# Patient Record
Sex: Male | Born: 1970 | Race: White | Hispanic: No | Smoking: Current some day smoker
Health system: Southern US, Community
[De-identification: ages and names within clinical notes are randomized; demographics above are authoritative.]

## PROBLEM LIST (undated history)

## (undated) DIAGNOSIS — I10 Essential (primary) hypertension: Secondary | ICD-10-CM

## (undated) DIAGNOSIS — E119 Type 2 diabetes mellitus without complications: Secondary | ICD-10-CM

## (undated) HISTORY — PX: TONSILLECTOMY: SUR1361

## (undated) HISTORY — PX: FRACTURE SURGERY: SHX138

---

## 2012-08-19 ENCOUNTER — Ambulatory Visit: Payer: Self-pay | Admitting: Family Medicine

## 2013-11-04 ENCOUNTER — Ambulatory Visit: Payer: Self-pay | Admitting: Internal Medicine

## 2014-10-17 ENCOUNTER — Encounter: Payer: Self-pay | Admitting: Emergency Medicine

## 2014-10-17 ENCOUNTER — Emergency Department: Payer: Self-pay

## 2014-10-17 ENCOUNTER — Emergency Department
Admission: EM | Admit: 2014-10-17 | Discharge: 2014-10-17 | Disposition: A | Discharge status: Home / Self Care | Payer: Self-pay | Attending: Emergency Medicine | Admitting: Emergency Medicine

## 2014-10-17 DIAGNOSIS — M79642 Pain in left hand: Secondary | ICD-10-CM | POA: Insufficient documentation

## 2014-10-17 DIAGNOSIS — I10 Essential (primary) hypertension: Secondary | ICD-10-CM | POA: Insufficient documentation

## 2014-10-17 DIAGNOSIS — M7989 Other specified soft tissue disorders: Secondary | ICD-10-CM | POA: Insufficient documentation

## 2014-10-17 DIAGNOSIS — Z720 Tobacco use: Secondary | ICD-10-CM | POA: Insufficient documentation

## 2014-10-17 HISTORY — DX: Essential (primary) hypertension: I10

## 2014-10-17 MED ORDER — SULFAMETHOXAZOLE-TRIMETHOPRIM 400-80 MG PO TABS: 1.0000 | ORAL_TABLET | Freq: Two times a day (BID) | ORAL | Status: DC

## 2014-10-17 MED ORDER — OXYCODONE-ACETAMINOPHEN 5-325 MG PO TABS: 1.0000 | ORAL_TABLET | Freq: Three times a day (TID) | ORAL | Status: AC | PRN

## 2014-10-17 MED ORDER — MELOXICAM 15 MG PO TABS: 15.0000 mg | ORAL_TABLET | Freq: Every day | ORAL | Status: DC | PRN

## 2014-10-17 NOTE — ED Notes (Signed)
Pt informed to return if any life threatening symptoms occur.  

## 2014-10-17 NOTE — ED Provider Notes (Signed)
[facility] Emergency Department Provider Note  ____________________________________________  Time seen: Approximately 8:13 AM  I have reviewed the triage vital signs and the nursing notes.   HISTORY  Chief Complaint Hand Pain and Joint Pain   HPI Parker Ponce is a 44 y.o. male presents to the ER with mother at the bedside for a complaint of left hand pain. Patient reports that his pain is mostly in left third finger for approximately 2 weeks. Patient states that he may have hit it on something but unsure if he definitely did. Patient states pain is 7 out of 10 aching pain and very tender to touch. Patient reports swelling intermittently, and reports that swelling today is actually decreased. Denies recent redness, drainage, break in skin. Denies fever.   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 Mother reports strong family history with herself and patient's brother with HSP, and states they often have joint pain. 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 Patient reports he has had this similar in the past, in the same finger and in all joints but states that usually it goes away by 2 weeks. Patient states that he presented today as he is concerned he may have broken his finger and wants to make sure it is not broken.  Denies chest pain, shortness of breath, abdominal pain, fever, urinary changes,  neck or back pain. Reports he frequently has joint pain in hands and feet and elbows but states left hand is the only pain at this time.   Past Medical History  Diagnosis Date  . Hypertension     There are no active problems to display for this patient.   Past Surgical History  Procedure Laterality Date  . Fracture surgery      Current Outpatient Rx  Name   Route  Sig  Dispense  Refill  . "blood pressure medication"          .           Marland Kitchen.             Allergies Review of patient's allergies indicates no known allergies.  No family history on file.  Social History History  Substance Use Topics  . Smoking status: Current Every Day Smoker  . Smokeless tobacco: Never Used  . Alcohol Use: No    Review of Systems Constitutional: No fever/chills Eyes: No visual changes. ENT: No sore throat. Cardiovascular: Denies chest pain. Respiratory: Denies shortness of breath. Gastrointestinal: No abdominal pain.  No nausea, no vomiting.  No diarrhea.  No constipation. Genitourinary: Negative for dysuria. Musculoskeletal: Negative for back pain. Left hand pain. Skin: Negative for rash. Neurological: Negative for headaches, focal weakness or numbness.  10-point ROS otherwise negative.  ____________________________________________   PHYSICAL EXAM:  VITAL SIGNS: ED Triage Vitals  Enc Vitals Group     BP 10/17/14 0801 134/72 mmHg     Pulse Rate 10/17/14 0801 72     Resp 10/17/14 0801 18     Temp 10/17/14 0801 97.6 F (36.4 C)     Temp Source 10/17/14 0801 Oral     SpO2 10/17/14 0801 97 %     Weight 10/17/14 0801 260 lb (117.935 kg)     Height 10/17/14 0801 5\' 9"  (1.753 m)     Head Cir --  Peak Flow --      Pain Score --      Pain Loc --      Pain Edu? --      Excl. in GC? --     Constitutional: Alert and oriented. Well appearing and in no acute distress. Eyes: Conjunctivae are normal. PERRL. EOMI. Head: Atraumatic. Nose: No congestion/rhinnorhea. Mouth/Throat: Mucous membranes are moist.  Oropharynx non-erythematous. Neck: No stridor.  No cervical spine tenderness to palpation. Hematological/Lymphatic/Immunilogical: No cervical lymphadenopathy. Cardiovascular: Normal rate, regular rhythm. Grossly normal heart sounds.  Good peripheral circulation. Respiratory: Normal respiratory effort.  No retractions. Lungs  CTAB. Gastrointestinal: Soft and nontender. No distention. No abdominal bruits. No CVA tenderness. Musculoskeletal: No lower extremity tenderness nor edema.  No joint effusions. Left hand third digit PIP joint moderate tender to palpation with mild to moderate swelling. Skin is intact. No erythema, fluctuance, pointing or induration. Full range of motion. Sensation intact and motor intact. Bilateral handgrips equal. Distal radial pulses equal bilaterally. Capillary refill <2 secs.  Left upper extremity nontender above the left hand. Right upper extremity and bilateral lower extremities nontender. Steady gait in room. Changes position quickly without distress. Neurologic:  Normal speech and language. No gross focal neurologic deficits are appreciated. Speech is normal. No gait instability. Skin:  Skin is warm, dry and intact. No rash noted. Psychiatric: Mood and affect are normal. Speech and behavior are normal.  ______________________________________  RADIOLOGY  LEFT HAND - COMPLETE 3+ VIEW  COMPARISON: None.  FINDINGS: Frontal, oblique, and lateral views obtained. There is soft tissue swelling of the third digit. No acute fracture or dislocation. Joint spaces appear intact. No erosive change or bony destruction. Well corticated calcification in the region of the triangular fibrocartilage may represent residua of old trauma.  IMPRESSION: Soft tissue swelling of third digit of uncertain etiology. This finding potentially could have arthropathic etiology, although infectious etiology is a differential consideration based on this appearance. No abscess seen. The joint spaces appear intact. Question old trauma in the triangular fibrocartilage region. No acute fracture or dislocation.   Electronically Signed By: Bretta Bang III M.D. On: 10/17/2014 08:52 ____________________________________________  ____________________________________________   INITIAL IMPRESSION /  ASSESSMENT AND PLAN / ED COURSE  Pertinent labs & imaging results that were available during my care of the patient were reviewed by me and considered in my medical decision making (see chart for details).  Patient presents to the ER for complaints of left hand pain, worse at left third PIP. Will evaluate by x-ray. 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 No signs of infection.  X-ray positive for soft tissue swelling of third digit, no abscess seen, no acute fracture or dislocation. Left hand pain, full ROM. Skin intact, no erythema. Suspect inflammation and likely related to patient's history of inflammatory disorder. Will treat with oral mobic and percocet as needed, ice and elevate. No signs of infection however due to swelling will also treat with oral bactrim. Discussed need for close follow up and return parameters. Follow up with PCP next week. Patient and mother agreed to plan and verbalized understanding.  ____________________________________________   FINAL CLINICAL IMPRESSION(S) / ED DIAGNOSES  Final diagnoses:  Hand pain, left      Renford Dills, NP 10/17/14 1137  Minna Antis, MD 10/17/14 [phone]

## 2014-10-17 NOTE — Discharge Instructions (Signed)
Take medication as prescribed. Apply ice and keep it elevated.  Follow up with her primary care physician or orthopedic next week.  Return to the ER for increased pain, increased swelling, redness, drainage, new or worsening concerns.  Musculoskeletal Pain Musculoskeletal pain is muscle and boney aches and pains. These pains can occur in any part of the body. Your caregiver may treat you without knowing the cause of the pain. They may treat you if blood or urine tests, X-rays, and other tests were normal.  CAUSES There is often not a definite cause or reason for these pains. These pains may be caused by a type of germ (virus). The discomfort may also come from overuse. Overuse includes working out too hard when your body is not fit. Boney aches also come from weather changes. Bone is sensitive to atmospheric pressure changes. HOME CARE INSTRUCTIONS   Ask when your test results will be ready. Make sure you get your test results.  Only take over-the-counter or prescription medicines for pain, discomfort, or fever as directed by your caregiver. If you were given medications for your condition, do not drive, operate machinery or power tools, or sign legal documents for 24 hours. Do not drink alcohol. Do not take sleeping pills or other medications that may interfere with treatment.  Continue all activities unless the activities cause more pain. When the pain lessens, slowly resume normal activities. Gradually increase the intensity and duration of the activities or exercise.  During periods of severe pain, bed rest may be helpful. Lay or sit in any position that is comfortable.  Putting ice on the injured area.  Put ice in a bag.  Place a towel between your skin and the bag.  Leave the ice on for 15 to 20 minutes, 3 to 4 times a day.  Follow up with your caregiver for continued problems and no reason can be found for the pain. If the pain becomes worse or does not go away, it may be necessary  to repeat tests or do additional testing. Your caregiver may need to look further for a possible cause. SEEK IMMEDIATE MEDICAL CARE IF:  You have pain that is getting worse and is not relieved by medications.  You develop chest pain that is associated with shortness or breath, sweating, feeling sick to your stomach (nauseous), or throw up (vomit).  Your pain becomes localized to the abdomen.  You develop any new symptoms that seem different or that concern you. MAKE SURE YOU:   Understand these instructions.  Will watch your condition.  Will get help right away if you are not doing well or get worse. Document Released: 05/09/2005 Document Revised: 08/01/2011 Document Reviewed: 01/11/2013 [facility] Patient Information 2015 [place] [place]. This information is not intended to replace advice given to you by your health care provider. Make sure you discuss any questions you have with your health care provider.

## 2014-10-17 NOTE — ED Notes (Signed)
Patient to ER for swelling to left middle finger. No redness or warmth present.

## 2015-01-10 ENCOUNTER — Emergency Department
Admission: EM | Admit: 2015-01-10 | Discharge: 2015-01-10 | Disposition: A | Discharge status: Home / Self Care | Payer: Self-pay | Attending: Emergency Medicine | Admitting: Emergency Medicine

## 2015-01-10 ENCOUNTER — Emergency Department: Payer: Self-pay

## 2015-01-10 ENCOUNTER — Encounter: Payer: Self-pay | Admitting: Emergency Medicine

## 2015-01-10 DIAGNOSIS — Z87891 Personal history of nicotine dependence: Secondary | ICD-10-CM | POA: Insufficient documentation

## 2015-01-10 DIAGNOSIS — I10 Essential (primary) hypertension: Secondary | ICD-10-CM | POA: Insufficient documentation

## 2015-01-10 DIAGNOSIS — L03114 Cellulitis of left upper limb: Secondary | ICD-10-CM

## 2015-01-10 DIAGNOSIS — Z79899 Other long term (current) drug therapy: Secondary | ICD-10-CM | POA: Insufficient documentation

## 2015-01-10 LAB — CBC WITH DIFFERENTIAL/PLATELET
BASOS PCT: 1 %
Basophils Absolute: 0 10*3/uL (ref 0–0.1)
Eosinophils Absolute: 0.1 10*3/uL (ref 0–0.7)
Eosinophils Relative: 2 %
HEMATOCRIT: 44.4 % (ref 40.0–52.0)
Hemoglobin: 15 g/dL (ref 13.0–18.0)
LYMPHS PCT: 30 %
Lymphs Abs: 2.3 10*3/uL (ref 1.0–3.6)
MCH: 31.3 pg (ref 26.0–34.0)
MCHC: 33.8 g/dL (ref 32.0–36.0)
MCV: 92.6 fL (ref 80.0–100.0)
MONO ABS: 0.5 10*3/uL (ref 0.2–1.0)
Monocytes Relative: 7 %
NEUTROS ABS: 4.6 10*3/uL (ref 1.4–6.5)
Neutrophils Relative %: 60 %
Platelets: 202 10*3/uL (ref 150–440)
RBC: 4.8 MIL/uL (ref 4.40–5.90)
RDW: 12 % (ref 11.5–14.5)
WBC: 7.6 10*3/uL (ref 3.8–10.6)

## 2015-01-10 LAB — COMPREHENSIVE METABOLIC PANEL
ALT: 158 U/L — ABNORMAL HIGH (ref 17–63)
ANION GAP: 11 (ref 5–15)
AST: 138 U/L — ABNORMAL HIGH (ref 15–41)
Albumin: 3.6 g/dL (ref 3.5–5.0)
Alkaline Phosphatase: 124 U/L (ref 38–126)
BILIRUBIN TOTAL: 0.2 mg/dL — AB (ref 0.3–1.2)
BUN: 13 mg/dL (ref 6–20)
CO2: 21 mmol/L — ABNORMAL LOW (ref 22–32)
Calcium: 8.9 mg/dL (ref 8.9–10.3)
Chloride: 97 mmol/L — ABNORMAL LOW (ref 101–111)
Creatinine, Ser: 0.97 mg/dL (ref 0.61–1.24)
GFR calc Af Amer: >60 mL/min (ref >60)
Glucose, Bld: 205 mg/dL — ABNORMAL HIGH (ref 65–99)
POTASSIUM: 3.9 mmol/L (ref 3.5–5.1)
Sodium: 129 mmol/L — ABNORMAL LOW (ref 135–145)
TOTAL PROTEIN: 7.2 g/dL (ref 6.5–8.1)

## 2015-01-10 MED ORDER — KETOROLAC TROMETHAMINE 30 MG/ML IJ SOLN
30.0000 mg | Freq: Once | INTRAMUSCULAR | Status: AC
  Administered 2015-01-10: 30 mg via INTRAVENOUS
  Filled 2015-01-10: qty 1

## 2015-01-10 MED ORDER — IBUPROFEN 800 MG PO TABS: 800.0000 mg | ORAL_TABLET | Freq: Three times a day (TID) | ORAL | Status: AC | PRN

## 2015-01-10 MED ORDER — CLINDAMYCIN PHOSPHATE 600 MG/50ML IV SOLN
600.0000 mg | Freq: Once | INTRAVENOUS | Status: AC
  Administered 2015-01-10: 600 mg via INTRAVENOUS
  Filled 2015-01-10: qty 50

## 2015-01-10 MED ORDER — TRAMADOL HCL 50 MG PO TABS: 50.0000 mg | ORAL_TABLET | Freq: Four times a day (QID) | ORAL | Status: AC | PRN

## 2015-01-10 MED ORDER — CLINDAMYCIN HCL 150 MG PO CAPS: 150.0000 mg | ORAL_CAPSULE | Freq: Four times a day (QID) | ORAL | Status: DC

## 2015-01-10 NOTE — ED Provider Notes (Signed)
[facility] Emergency Department Provider Note  ____________________________________________  Time seen: Approximately 12:06 PM  I have reviewed the triage vital signs and the nursing notes.   HISTORY  Chief Complaint Hand Pain    HPI Parker Ponce is a 44 y.o. male complaining of left hand pain, edema and erythema times one week. Patient had a Fish barb stuck in his hand. Patient states 2 days after removal of the barb he noticed redness and swelling. Patient's stay in the last 24 hours he has noted increasing swelling and redness. Patient denies any fever or discharge from the incision site. Patient state is a burning type pain and he rates it as 10 over 10. No palliative measures taken for this complaint.  Past Medical History  Diagnosis Date  . Hypertension     There are no active problems to display for this patient.   Past Surgical History  Procedure Laterality Date  . Fracture surgery      Current Outpatient Rx  Name  Route  Sig  Dispense  Refill  . clindamycin (CLEOCIN) 150 MG capsule   Oral   Take 1 capsule (150 mg total) by mouth 4 (four) times daily.   40 capsule   0   . ibuprofen (ADVIL,MOTRIN) 800 MG tablet   Oral   Take 1 tablet (800 mg total) by mouth every 8 (eight) hours as needed for moderate pain.   15 tablet   0   . meloxicam (MOBIC) 15 MG tablet   Oral   Take 1 tablet (15 mg total) by mouth daily as needed for pain.   10 tablet   0   . oxyCODONE-acetaminophen (ROXICET) 5-325 MG per tablet   Oral   Take 1 tablet by mouth every 8 (eight) hours as needed for moderate pain or severe pain (Do not drive or operate heavy machinery while taking as can cause drowsiness.).   12 tablet   0   . sulfamethoxazole-trimethoprim (BACTRIM) 400-80 MG per tablet   Oral   Take 1 tablet by mouth 2 (two) times daily.   20 tablet   0   . traMADol (ULTRAM) 50 MG tablet   Oral   Take 1 tablet (50 mg total) by mouth every 6 (six)  hours as needed for moderate pain.   12 tablet   0     Allergies Review of patient's allergies indicates no known allergies.  No family history on file.  Social History Social History  Substance Use Topics  . Smoking status: Former Smoker    Types: Cigarettes  . Smokeless tobacco: Never Used  . Alcohol Use: No    Review of Systems Constitutional: No fever/chills Eyes: No visual changes. ENT: No sore throat. Cardiovascular: Denies chest pain. Respiratory: Denies shortness of breath. Gastrointestinal: No abdominal pain.  No nausea, no vomiting.  No diarrhea.  No constipation. Genitourinary: Negative for dysuria. Musculoskeletal: Pain and edema to the left hand. Skin: Negative for rash. Erythema and edema dorsal aspect of left hand. Scabbed over puncture wound to the dorsal aspect of the left hand. Neurological: Negative for headaches, focal weakness or numbness. Endocrine:Hypertension 10-point ROS otherwise negative.  ____________________________________________   PHYSICAL EXAM:  VITAL SIGNS: ED Triage Vitals  Enc Vitals Group     BP 01/10/15 1036 123/45 mmHg     Pulse Rate 01/10/15 1036 77     Resp 01/10/15 1036 18     Temp 01/10/15 1036 98.2 F (36.8 C)  Temp Source 01/10/15 1036 Oral     SpO2 01/10/15 1036 97 %     Weight 01/10/15 1036 240 lb (108.863 kg)     Height 01/10/15 1036 5\' 9"  (1.753 m)     Head Cir --      Peak Flow --      Pain Score 01/10/15 1038 10     Pain Loc --      Pain Edu? --      Excl. in GC? --     Constitutional: Alert and oriented. Well appearing and in no acute distress. Eyes: Conjunctivae are normal. PERRL. EOMI. Head: Atraumatic. Nose: No congestion/rhinnorhea. Mouth/Throat: Mucous membranes are moist.  Oropharynx non-erythematous. Neck: No stridor. No cervical spine tenderness to palpation. Hematological/Lymphatic/Immunilogical: No cervical lymphadenopathy. Cardiovascular: Normal rate, regular rhythm. Grossly normal  heart sounds.  Good peripheral circulation. Respiratory: Normal respiratory effort.  No retractions. Lungs CTAB. Gastrointestinal: Soft and nontender. No distention. No abdominal bruits. No CVA tenderness. Musculoskeletal: No lower extremity tenderness nor edema.  No joint effusions. Neurologic:  Normal speech and language. No gross focal neurologic deficits are appreciated. No gait instability. Skin:  Edema and erythema dorsal aspect of the left hand. Psychiatric: Mood and affect are normal. Speech and behavior are normal.  ____________________________________________   LABS (all labs ordered are listed, but only abnormal results are displayed)  Labs Reviewed  COMPREHENSIVE METABOLIC PANEL - Abnormal; Notable for the following:    Sodium 129 (*)    Chloride 97 (*)    CO2 21 (*)    Glucose, Bld 205 (*)    AST 138 (*)    ALT 158 (*)    Total Bilirubin 0.2 (*)    All other components within normal limits  CBC WITH DIFFERENTIAL/PLATELET   ____________________________________________  EKG   ____________________________________________  RADIOLOGY  No acute findings on x-ray. There is a 1 mm foreign body visible in the fourth digit of the left hand. Unrelated to the incision site of the catfish injury. I, Joni Reining, personally viewed and evaluated these images (plain radiographs) as part of my medical decision making.   ____________________________________________   PROCEDURES  Procedure(s) performed: None  Critical Care performed: No  ____________________________________________   INITIAL IMPRESSION / ASSESSMENT AND PLAN / ED COURSE  Pertinent labs & imaging results that were available during my care of the patient were reviewed by me and considered in my medical decision making (see chart for details).  Cellulitis left hand. Patient given IV clindamycin. Patient will be discharged with clindamycin orally, ibuprofen, and tramadol. Patient advised follow-up family  doctor temp F condition worsens. ____________________________________________   FINAL CLINICAL IMPRESSION(S) / ED DIAGNOSES  Final diagnoses:  Cellulitis of left hand excluding fingers and thumb      Joni Reining, PA-C 01/10/15 1221  Minna Antis, MD 01/10/15 [phone]

## 2015-01-10 NOTE — ED Notes (Signed)
CBC reviewed, pt moved to flex wait.

## 2015-01-10 NOTE — ED Notes (Signed)
States got catfish barb stuck in hand one week ago, states 2 days later got more swollen

## 2015-05-07 ENCOUNTER — Encounter: Payer: Self-pay | Admitting: Internal Medicine

## 2015-05-07 ENCOUNTER — Other Ambulatory Visit: Payer: Self-pay | Admitting: Internal Medicine

## 2015-05-07 DIAGNOSIS — I10 Essential (primary) hypertension: Secondary | ICD-10-CM | POA: Insufficient documentation

## 2015-05-07 DIAGNOSIS — R413 Other amnesia: Secondary | ICD-10-CM

## 2015-05-07 DIAGNOSIS — M25559 Pain in unspecified hip: Secondary | ICD-10-CM | POA: Insufficient documentation

## 2015-05-07 DIAGNOSIS — R600 Localized edema: Secondary | ICD-10-CM | POA: Insufficient documentation

## 2015-07-01 ENCOUNTER — Encounter: Payer: Self-pay | Admitting: Emergency Medicine

## 2015-07-01 ENCOUNTER — Emergency Department
Admission: EM | Admit: 2015-07-01 | Discharge: 2015-07-01 | Disposition: A | Discharge status: Home / Self Care | Payer: Self-pay | Attending: Emergency Medicine | Admitting: Emergency Medicine

## 2015-07-01 DIAGNOSIS — L03115 Cellulitis of right lower limb: Secondary | ICD-10-CM | POA: Insufficient documentation

## 2015-07-01 DIAGNOSIS — Z79899 Other long term (current) drug therapy: Secondary | ICD-10-CM | POA: Insufficient documentation

## 2015-07-01 DIAGNOSIS — Z87891 Personal history of nicotine dependence: Secondary | ICD-10-CM | POA: Insufficient documentation

## 2015-07-01 DIAGNOSIS — Z792 Long term (current) use of antibiotics: Secondary | ICD-10-CM | POA: Insufficient documentation

## 2015-07-01 DIAGNOSIS — I10 Essential (primary) hypertension: Secondary | ICD-10-CM | POA: Insufficient documentation

## 2015-07-01 LAB — CBC WITH DIFFERENTIAL/PLATELET
BASOS ABS: 0.1 10*3/uL (ref 0–0.1)
Basophils Relative: 1 %
EOS PCT: 1 %
Eosinophils Absolute: 0.1 10*3/uL (ref 0–0.7)
HCT: 45.1 % (ref 40.0–52.0)
HEMOGLOBIN: 15.2 g/dL (ref 13.0–18.0)
LYMPHS PCT: 15 %
Lymphs Abs: 1.5 10*3/uL (ref 1.0–3.6)
MCH: 30.4 pg (ref 26.0–34.0)
MCHC: 33.7 g/dL (ref 32.0–36.0)
MCV: 90.3 fL (ref 80.0–100.0)
Monocytes Absolute: 0.6 10*3/uL (ref 0.2–1.0)
Monocytes Relative: 6 %
NEUTROS PCT: 77 %
Neutro Abs: 7.6 10*3/uL — ABNORMAL HIGH (ref 1.4–6.5)
Platelets: 216 10*3/uL (ref 150–440)
RBC: 4.99 MIL/uL (ref 4.40–5.90)
RDW: 12.1 % (ref 11.5–14.5)
WBC: 9.8 10*3/uL (ref 3.8–10.6)

## 2015-07-01 LAB — BASIC METABOLIC PANEL
Anion gap: 10 (ref 5–15)
BUN: 16 mg/dL (ref 6–20)
CHLORIDE: 107 mmol/L (ref 101–111)
CO2: 23 mmol/L (ref 22–32)
Calcium: 9.4 mg/dL (ref 8.9–10.3)
Creatinine, Ser: 1.11 mg/dL (ref 0.61–1.24)
GFR calc Af Amer: >60 mL/min (ref >60)
Glucose, Bld: 95 mg/dL (ref 65–99)
POTASSIUM: 4 mmol/L (ref 3.5–5.1)
SODIUM: 140 mmol/L (ref 135–145)

## 2015-07-01 LAB — URIC ACID: URIC ACID, SERUM: 7.5 mg/dL (ref 4.4–7.6)

## 2015-07-01 MED ORDER — MELOXICAM 15 MG PO TABS: 15.0000 mg | ORAL_TABLET | Freq: Every day | ORAL | Status: DC

## 2015-07-01 MED ORDER — TRAMADOL HCL 50 MG PO TABS
50.0000 mg | ORAL_TABLET | Freq: Once | ORAL | Status: AC
  Administered 2015-07-01: 50 mg via ORAL
  Filled 2015-07-01: qty 1

## 2015-07-01 MED ORDER — OXYCODONE-ACETAMINOPHEN 5-325 MG PO TABS: 1.0000 | ORAL_TABLET | ORAL | Status: AC | PRN

## 2015-07-01 MED ORDER — IBUPROFEN 800 MG PO TABS
800.0000 mg | ORAL_TABLET | Freq: Once | ORAL | Status: AC
  Administered 2015-07-01: 800 mg via ORAL
  Filled 2015-07-01: qty 1

## 2015-07-01 MED ORDER — SULFAMETHOXAZOLE-TRIMETHOPRIM 800-160 MG PO TABS: 1.0000 | ORAL_TABLET | Freq: Two times a day (BID) | ORAL | Status: DC

## 2015-07-01 NOTE — Discharge Instructions (Signed)
Cellulitis °Cellulitis is an infection of the skin and the tissue under the skin. The infected area is usually red and tender. This happens most often in the arms and lower legs. °HOME CARE  °· Take your antibiotic medicine as told. Finish the medicine even if you start to feel better. °· Keep the infected arm or leg raised (elevated). °· Put a warm cloth on the area up to 4 times per day. °· Only take medicines as told by your doctor. °· Keep all doctor visits as told. °GET HELP IF: °· You see red streaks on the skin coming from the infected area. °· Your red area gets bigger or turns a dark color. °· Your bone or joint under the infected area is painful after the skin heals. °· Your infection comes back in the same area or different area. °· You have a puffy (swollen) bump in the infected area. °· You have new symptoms. °· You have a fever. °GET HELP RIGHT AWAY IF:  °· You feel very sleepy. °· You throw up (vomit) or have watery poop (diarrhea). °· You feel sick and have muscle aches and pains. °  °This information is not intended to replace advice given to you by your health care provider. Make sure you discuss any questions you have with your health care provider. °  °Document Released: 10/26/2007 Document Revised: 01/28/2015 Document Reviewed: 07/25/2011 °Elsevier Interactive Patient Education ©2016 [employer] ° °

## 2015-07-01 NOTE — ED Notes (Signed)
Pt comes into the ED via POV c/o right foot swelling.  Patient states it started on Monday and has increased in swelling.  Denies any injury to the area.

## 2015-07-01 NOTE — ED Provider Notes (Signed)
[address] [facility] Emergency Department Provider Note  ____________________________________________  Time seen: Approximately 3:49 PM  I have reviewed the triage vital signs and the nursing notes.   HISTORY  Chief Complaint Foot Swelling    HPI Parker Ponce is a 45 y.o. male patient complains of 2-3 days of right foot swelling and pain. Patient said is also some redness of the foot. Patient stateno provocative incident for this complaint. He rates his pain as a 9/10. No positive measures taken for this complaint.   Past Medical History  Diagnosis Date  . Hypertension     Patient Active Problem List   Diagnosis Date Noted  . Edema extremities 05/07/2015  . Arthralgia of hip 05/07/2015  . BP (high blood pressure) 05/07/2015  . Amnesia 05/07/2015    Past Surgical History  Procedure Laterality Date  . Fracture surgery    . Tonsillectomy      Current Outpatient Rx  Name  Route  Sig  Dispense  Refill  . clindamycin (CLEOCIN) 150 MG capsule   Oral   Take 1 capsule (150 mg total) by mouth 4 (four) times daily.   40 capsule   0   . FLUoxetine (PROZAC) 40 MG capsule   Oral   Take 40 mg by mouth daily.         Marland Kitchen gabapentin (NEURONTIN) 300 MG capsule   Oral   Take 300 mg by mouth 3 (three) times daily.         Marland Kitchen ibuprofen (ADVIL,MOTRIN) 800 MG tablet   Oral   Take 1 tablet (800 mg total) by mouth every 8 (eight) hours as needed for moderate pain.   15 tablet   0   . lisinopril-hydrochlorothiazide (PRINZIDE,ZESTORETIC) 10-12.5 MG tablet   Oral   Take 1 tablet by mouth daily.         Marland Kitchen loratadine (CLARITIN REDITABS) 10 MG dissolvable tablet   Oral   Take 1 tablet by mouth daily.         . meloxicam (MOBIC) 15 MG tablet   Oral   Take 1 tablet (15 mg total) by mouth daily as needed for pain.   10 tablet   0   . meloxicam (MOBIC) 15 MG tablet   Oral   Take 1 tablet (15 mg total) by mouth daily.   30 tablet   2   .  omeprazole (PRILOSEC) 40 MG capsule   Oral   Take 1 capsule by mouth daily.         Marland Kitchen oxyCODONE-acetaminophen (ROXICET) 5-325 MG per tablet   Oral   Take 1 tablet by mouth every 8 (eight) hours as needed for moderate pain or severe pain (Do not drive or operate heavy machinery while taking as can cause drowsiness.).   12 tablet   0   . oxyCODONE-acetaminophen (ROXICET) 5-325 MG tablet   Oral   Take 1 tablet by mouth every 4 (four) hours as needed for severe pain.   30 tablet   0   . sulfamethoxazole-trimethoprim (BACTRIM DS,SEPTRA DS) 800-160 MG tablet   Oral   Take 1 tablet by mouth 2 (two) times daily.   20 tablet   0   . sulfamethoxazole-trimethoprim (BACTRIM) 400-80 MG per tablet   Oral   Take 1 tablet by mouth 2 (two) times daily.   20 tablet   0   . traMADol (ULTRAM) 50 MG tablet   Oral   Take 1 tablet (50 mg total) by mouth  every 6 (six) hours as needed for moderate pain.   12 tablet   0   . traZODone (DESYREL) 100 MG tablet   Oral   Take 100 mg by mouth at bedtime.           Allergies Review of patient's allergies indicates no known allergies.  Family History  Problem Relation Age of Onset  . Neurologic Disorder Mother     Spastic paraparesis    Social History Social History  Substance Use Topics  . Smoking status: Former Smoker    Types: Cigarettes  . Smokeless tobacco: Never Used  . Alcohol Use: No    Review of Systems Constitutional: No fever/chills Eyes: No visual changes. ENT: No sore throat. Cardiovascular: Denies chest pain. Respiratory: Denies shortness of breath. Gastrointestinal: No abdominal pain.  No nausea, no vomiting.  No diarrhea.  No constipation. Genitourinary: Negative for dysuria. Musculoskeletal: Right foot pain Skin: Negative for rash. Edema and erythema Neurological: Negative for headaches, focal weakness or numbness. 10-point ROS otherwise negative.  ____________________________________________   PHYSICAL  EXAM:  VITAL SIGNS: ED Triage Vitals  Enc Vitals Group     BP 07/01/15 1535 146/82 mmHg     Pulse Rate 07/01/15 1535 84     Resp 07/01/15 1535 18     Temp 07/01/15 1535 98 F (36.7 C)     Temp Source 07/01/15 1535 Oral     SpO2 07/01/15 1535 95 %     Weight 07/01/15 1535 242 lb (109.77 kg)     Height 07/01/15 1535  (1.753 m)     Head Cir --      Peak Flow --      Pain Score 07/01/15 1539 9     Pain Loc --      Pain Edu? --      Excl. in GC? --     Constitutional: Alert and oriented. Well appearing and in no acute distress. Poor personal hygiene Eyes: Conjunctivae are normal. PERRL. EOMI. Head: Atraumatic. Nose: No congestion/rhinnorhea. Mouth/Throat: Mucous membranes are moist.  Oropharynx non-erythematous. Neck: No stridor. No cervical spine tenderness to palpation. Hematological/Lymphatic/Immunilogical: No cervical lymphadenopathy. Cardiovascular: Normal rate, regular rhythm. Grossly normal heart sounds.  Good peripheral circulation. Respiratory: Normal respiratory effort.  No retractions. Lungs CTAB. Gastrointestinal: Soft and nontender. No distention. No abdominal bruits. No CVA tenderness. Musculoskeletal: No obvious deformity. Moderate edema. Neurologic:  Normal speech and language. No gross focal neurologic deficits are appreciated. No gait instability. Skin:  Skin is warm, dry and intact. Mild erythema Psychiatric: Mood and affect are normal. Speech and behavior are normal.  ____________________________________________   LABS (all labs ordered are listed, but only abnormal results are displayed)  Labs Reviewed  CBC WITH DIFFERENTIAL/PLATELET - Abnormal; Notable for the following:    Neutro Abs 7.6 (*)    All other components within normal limits  BASIC METABOLIC PANEL  URIC ACID    ____________________________________________  EKG   ____________________________________________  RADIOLOGY   ____________________________________________   PROCEDURES  Procedure(s) performed: None  Critical Care performed: No  ____________________________________________   INITIAL IMPRESSION / ASSESSMENT AND PLAN / ED COURSE  Pertinent labs & imaging results that were available during my care of the patient were reviewed by me and considered in my medical decision making (see chart for details).  Cellulitis right foot. Patient given prescription for Bactrim, meloxicam and Percocets. Patient advised follow-up family doctor if condition persists. ____________________________________________   FINAL CLINICAL IMPRESSION(S) / ED DIAGNOSES  Final diagnoses:  Cellulitis of right foot      Joni Reining, PA-C 07/01/15 1726  Minna Antis, MD 07/01/15 2257

## 2016-05-09 ENCOUNTER — Emergency Department
Admission: EM | Admit: 2016-05-09 | Discharge: 2016-05-09 | Disposition: A | Discharge status: Home / Self Care | Payer: Self-pay | Attending: Emergency Medicine | Admitting: Emergency Medicine

## 2016-05-09 ENCOUNTER — Emergency Department: Payer: Self-pay

## 2016-05-09 ENCOUNTER — Encounter: Payer: Self-pay | Admitting: *Deleted

## 2016-05-09 DIAGNOSIS — Z791 Long term (current) use of non-steroidal anti-inflammatories (NSAID): Secondary | ICD-10-CM | POA: Insufficient documentation

## 2016-05-09 DIAGNOSIS — I10 Essential (primary) hypertension: Secondary | ICD-10-CM | POA: Insufficient documentation

## 2016-05-09 DIAGNOSIS — F17210 Nicotine dependence, cigarettes, uncomplicated: Secondary | ICD-10-CM | POA: Insufficient documentation

## 2016-05-09 DIAGNOSIS — Z79899 Other long term (current) drug therapy: Secondary | ICD-10-CM | POA: Insufficient documentation

## 2016-05-09 DIAGNOSIS — M25562 Pain in left knee: Secondary | ICD-10-CM | POA: Insufficient documentation

## 2016-05-09 MED ORDER — MELOXICAM 15 MG PO TABS
15.0000 mg | ORAL_TABLET | Freq: Every day | ORAL | 0 refills | Status: AC
Start: 2016-05-09 — End: ?

## 2016-05-09 NOTE — ED Provider Notes (Signed)
[facility] Emergency Department Provider Note  ____________________________________________  Time seen: Approximately 10:21 PM  I have reviewed the triage vital signs and the nursing notes.   HISTORY  Chief Complaint Knee Pain    HPI Parker Ponce is a 45 y.o. male who presents to emergency department complaining of left knee pain. The patient states that he has noticed swelling and pain to his knee for "several weeks". Patient states that today the pain increased and he presented to the emergency department for evaluation. Patient states that he has had a long history of recurring joint pain. He reports that this is multi-articular in nature. He reports that he has had shoulder pain, elbow pain, MCP joint pain, knee pain, ankle pain. Patient reports that he is been evaluated multiple times in the emergency department, by primary care, and urgent care. He states that he has had his uric acid level drawn multiple times with it always being within normal range. The patient states that typically after prescribed medication usage, joint pain will resolve and disappear for a time. It then will return in another joint. Patient states that he does have a familial history of autoimmune joint disorders. He is unsure of what disorder runs in his family but states that his brother, mother, other family members on mother's side has had recurring joint pain. Patient is never been evaluated for rheumatoid arthritis. He has not seen an orthopedic surgeon for these complaints. Patient denies any erythema to the joint, loss of range of motion, numbness or tingling distally. No fevers or chills. No nausea or vomiting. No medications prior to arrival.   Past Medical History:  Diagnosis Date  . Hypertension     Patient Active Problem List   Diagnosis Date Noted  . Edema extremities 05/07/2015  . Arthralgia of hip 05/07/2015  . BP (high blood pressure) 05/07/2015  . Amnesia  05/07/2015    Past Surgical History:  Procedure Laterality Date  . FRACTURE SURGERY    . TONSILLECTOMY      Prior to Admission medications   Medication Sig Start Date End Date Taking? Authorizing Provider  clindamycin (CLEOCIN) 150 MG capsule Take 1 capsule (150 mg total) by mouth 4 (four) times daily. 01/10/15   Joni Reiningonald K Smith, PA-C  FLUoxetine (PROZAC) 40 MG capsule Take 40 mg by mouth daily.    Historical Provider, MD  gabapentin (NEURONTIN) 300 MG capsule Take 300 mg by mouth 3 (three) times daily.    Historical Provider, MD  ibuprofen (ADVIL,MOTRIN) 800 MG tablet Take 1 tablet (800 mg total) by mouth every 8 (eight) hours as needed for moderate pain. 01/10/15   Joni Reiningonald K Smith, PA-C  lisinopril-hydrochlorothiazide (PRINZIDE,ZESTORETIC) 10-12.5 MG tablet Take 1 tablet by mouth daily. 11/04/13   Historical Provider, MD  loratadine (CLARITIN REDITABS) 10 MG dissolvable tablet Take 1 tablet by mouth daily.    Historical Provider, MD  meloxicam (MOBIC) 15 MG tablet Take 1 tablet (15 mg total) by mouth daily. 05/09/16   Delorise RoyalsJonathan D Hazelyn Kallen, PA-C  omeprazole (PRILOSEC) 40 MG capsule Take 1 capsule by mouth daily.    Historical Provider, MD  oxyCODONE-acetaminophen (ROXICET) 5-325 MG per tablet Take 1 tablet by mouth every 8 (eight) hours as needed for moderate pain or severe pain (Do not drive or operate heavy machinery while taking as can cause drowsiness.). 10/17/14   Renford DillsLindsey Miller, NP  oxyCODONE-acetaminophen (ROXICET) 5-325 MG tablet Take 1 tablet by mouth every 4 (four) hours as needed for severe pain.  07/01/15   Joni Reiningonald K Smith, PA-C  sulfamethoxazole-trimethoprim (BACTRIM DS,SEPTRA DS) 800-160 MG tablet Take 1 tablet by mouth 2 (two) times daily. 07/01/15   Joni Reiningonald K Smith, PA-C  sulfamethoxazole-trimethoprim (BACTRIM) 400-80 MG per tablet Take 1 tablet by mouth 2 (two) times daily. 10/17/14   Renford DillsLindsey Miller, NP  traMADol (ULTRAM) 50 MG tablet Take 1 tablet (50 mg total) by mouth every 6 (six)  hours as needed for moderate pain. 01/10/15   Joni Reiningonald K Smith, PA-C  traZODone (DESYREL) 100 MG tablet Take 100 mg by mouth at bedtime.    Historical Provider, MD    Allergies Patient has no known allergies.  Family History  Problem Relation Age of Onset  . Neurologic Disorder Mother     Spastic paraparesis    Social History Social History  Substance Use Topics  . Smoking status: Current Some Day Smoker    Types: Cigarettes  . Smokeless tobacco: Never Used  . Alcohol use No     Review of Systems  Constitutional: No fever/chills Cardiovascular: no chest pain. Respiratory: no cough. No SOB. Musculoskeletal: Positive for left knee pain Skin: Negative for rash, abrasions, lacerations, ecchymosis. Neurological: Negative for headaches, focal weakness or numbness. 10-point ROS otherwise negative.  ____________________________________________   PHYSICAL EXAM:  VITAL SIGNS: ED Triage Vitals  Enc Vitals Group     BP 05/09/16 2108 121/76     Pulse Rate 05/09/16 2108 79     Resp 05/09/16 2108 18     Temp 05/09/16 2108 98.2 F (36.8 C)     Temp Source 05/09/16 2108 Oral     SpO2 05/09/16 2108 96 %     Weight 05/09/16 2110 221 lb (100.2 kg)     Height 05/09/16 2110 5\' 9"  (1.753 m)     Head Circumference --      Peak Flow --      Pain Score 05/09/16 2110 8     Pain Loc --      Pain Edu? --      Excl. in GC? --      Constitutional: Alert and oriented. Well appearing and in no acute distress. Eyes: Conjunctivae are normal. PERRL. EOMI. Head: Atraumatic.  Neck: No stridor.    Cardiovascular: Normal rate, regular rhythm. Normal S1 and S2.  Good peripheral circulation. Respiratory: Normal respiratory effort without tachypnea or retractions. Lungs CTAB. Good air entry to the bases with no decreased or absent breath sounds. Musculoskeletal: Full range of motion to all extremities. No gross deformities appreciated.No deformity noted to left knee upon inspection. There is soft  tissue swelling over the patellar region. No erythema. No warmth to palpation. Full range of motion to knee. No ballottement. Varus, valgus, Lachman's, McMurray's is negative. Dorsalis pedis pulse intact distally. Sensation intact distally. No edema of lower extremity. No warmth to palpation. Neurologic:  Normal speech and language. No gross focal neurologic deficits are appreciated.  Skin:  Skin is warm, dry and intact. No rash noted. Psychiatric: Mood and affect are normal. Speech and behavior are normal. Patient exhibits appropriate insight and judgement.   ____________________________________________   LABS (all labs ordered are listed, but only abnormal results are displayed)  Labs Reviewed - No data to display ____________________________________________  EKG   ____________________________________________  RADIOLOGY Festus BarrenI, Jaylene Arrowood D Nikki Rusnak, personally viewed and evaluated these images (plain radiographs) as part of my medical decision making, as well as reviewing the written report by the radiologist.  Dg Knee Complete 4 Views Left  Result Date:  05/09/2016 CLINICAL DATA:  Nontraumatic left knee pain, onset at noon yesterday. EXAM: LEFT KNEE - COMPLETE 4+ VIEW COMPARISON:  None. FINDINGS: Negative for acute fracture, dislocation or other acute bony abnormality. There is prepatellar soft tissue swelling. No soft tissue gas or foreign body. No significant arthritic changes. No bone lesion or bony destruction. IMPRESSION: Prepatellar soft tissue swelling. Electronically Signed   By: Ellery Plunk M.D.   On: 05/09/2016 21:46    ____________________________________________    PROCEDURES  Procedure(s) performed:    Procedures    Medications - No data to display   ____________________________________________   INITIAL IMPRESSION / ASSESSMENT AND PLAN / ED COURSE  Pertinent labs & imaging results that were available during my care of the patient were reviewed by me  and considered in my medical decision making (see chart for details).  Review of the [place] CSRS was performed in accordance of the [facility] prior to dispensing any controlled drugs.  Clinical Course     Patient's diagnosis is consistent with Left knee pain. Patient has a history of reoccurring multiarticular joint pain. There is edema in the prepatellar region that is also identified on x-ray. X-ray reveals no other acute osseous normality. Exam was reassuring with no indication of gout, septic arthritis, ligamentous injury or meniscal tear. Patient has a recurring history of multiple joints being involved with similar symptoms. This typically resolves with anti-inflammatory use. At this time, it is felt likely that patient may have autoimmune disorder such as rheumatoid arthritis causing multiarticular pain. Patient is given anti-inflammatories at this time for symptom control but is advised to follow-up with orthopedics for further evaluation and management for testing of possible autoimmune disorders to include rheumatoid arthritis.. Otherwise, patient will follow-up with primary care.. Patient is given ED precautions to return to the ED for any worsening or new symptoms.     ____________________________________________  FINAL CLINICAL IMPRESSION(S) / ED DIAGNOSES  Final diagnoses:  Acute pain of left knee      NEW MEDICATIONS STARTED DURING THIS VISIT:  New Prescriptions   MELOXICAM (MOBIC) 15 MG TABLET    Take 1 tablet (15 mg total) by mouth daily.        This chart was dictated using voice recognition software/Dragon. Despite best efforts to proofread, errors can occur which can change the meaning. Any change was purely unintentional.    Racheal Patches, PA-C 05/09/16 2259    Sharman Cheek, MD 05/10/16 2243

## 2016-05-09 NOTE — ED Triage Notes (Signed)
Patient states left knee became painful since noon yesterday. Patient c/o difficulty bending knee.

## 2016-06-02 IMAGING — CR DG HAND COMPLETE 3+V*L*
1 series · 3 of 3 positions shown · non-contrast
Comparison: 10/17/2014.

CLINICAL DATA: Foreign body.  LEFT hand pain.  Fissuring injury.

EXAM:
LEFT HAND - COMPLETE 3+ VIEW

[Series 1: dg hand complete left · 0.14mm/px · 3 of 3 slices shown]
[im 1/3]
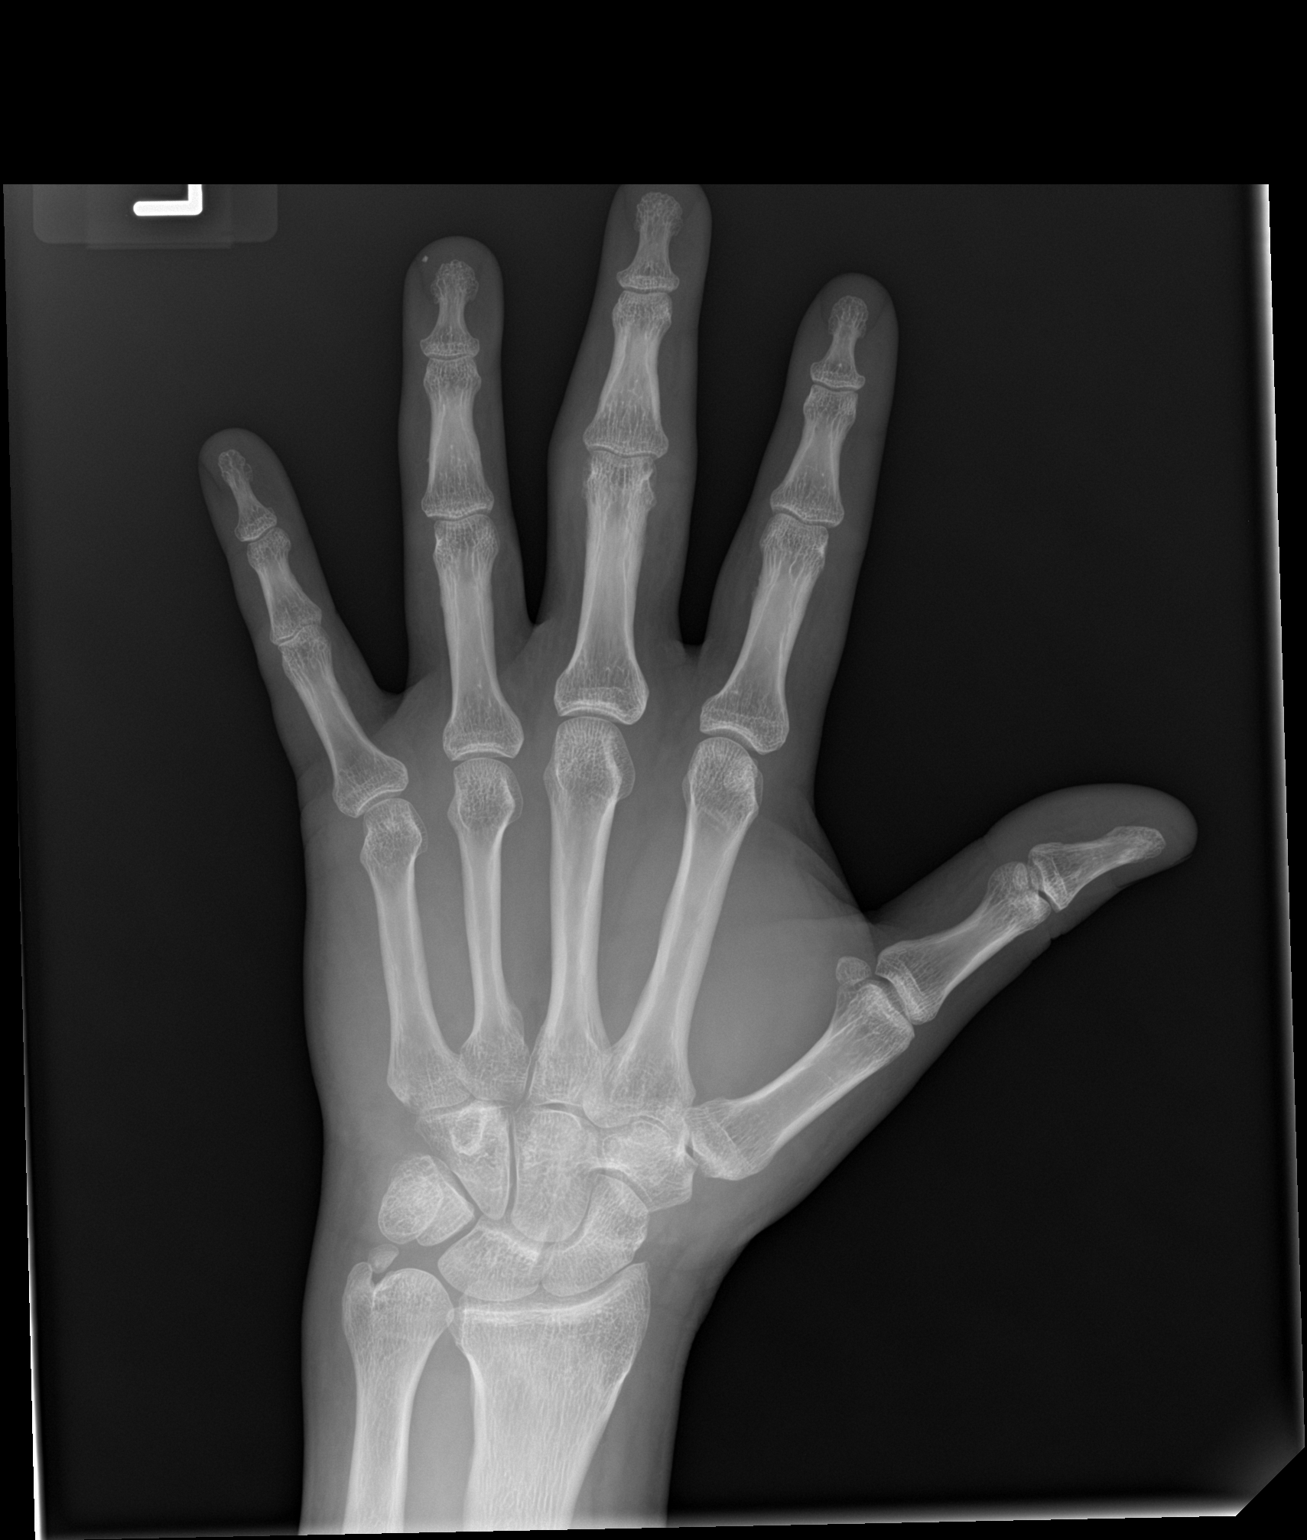
[im 2/3]
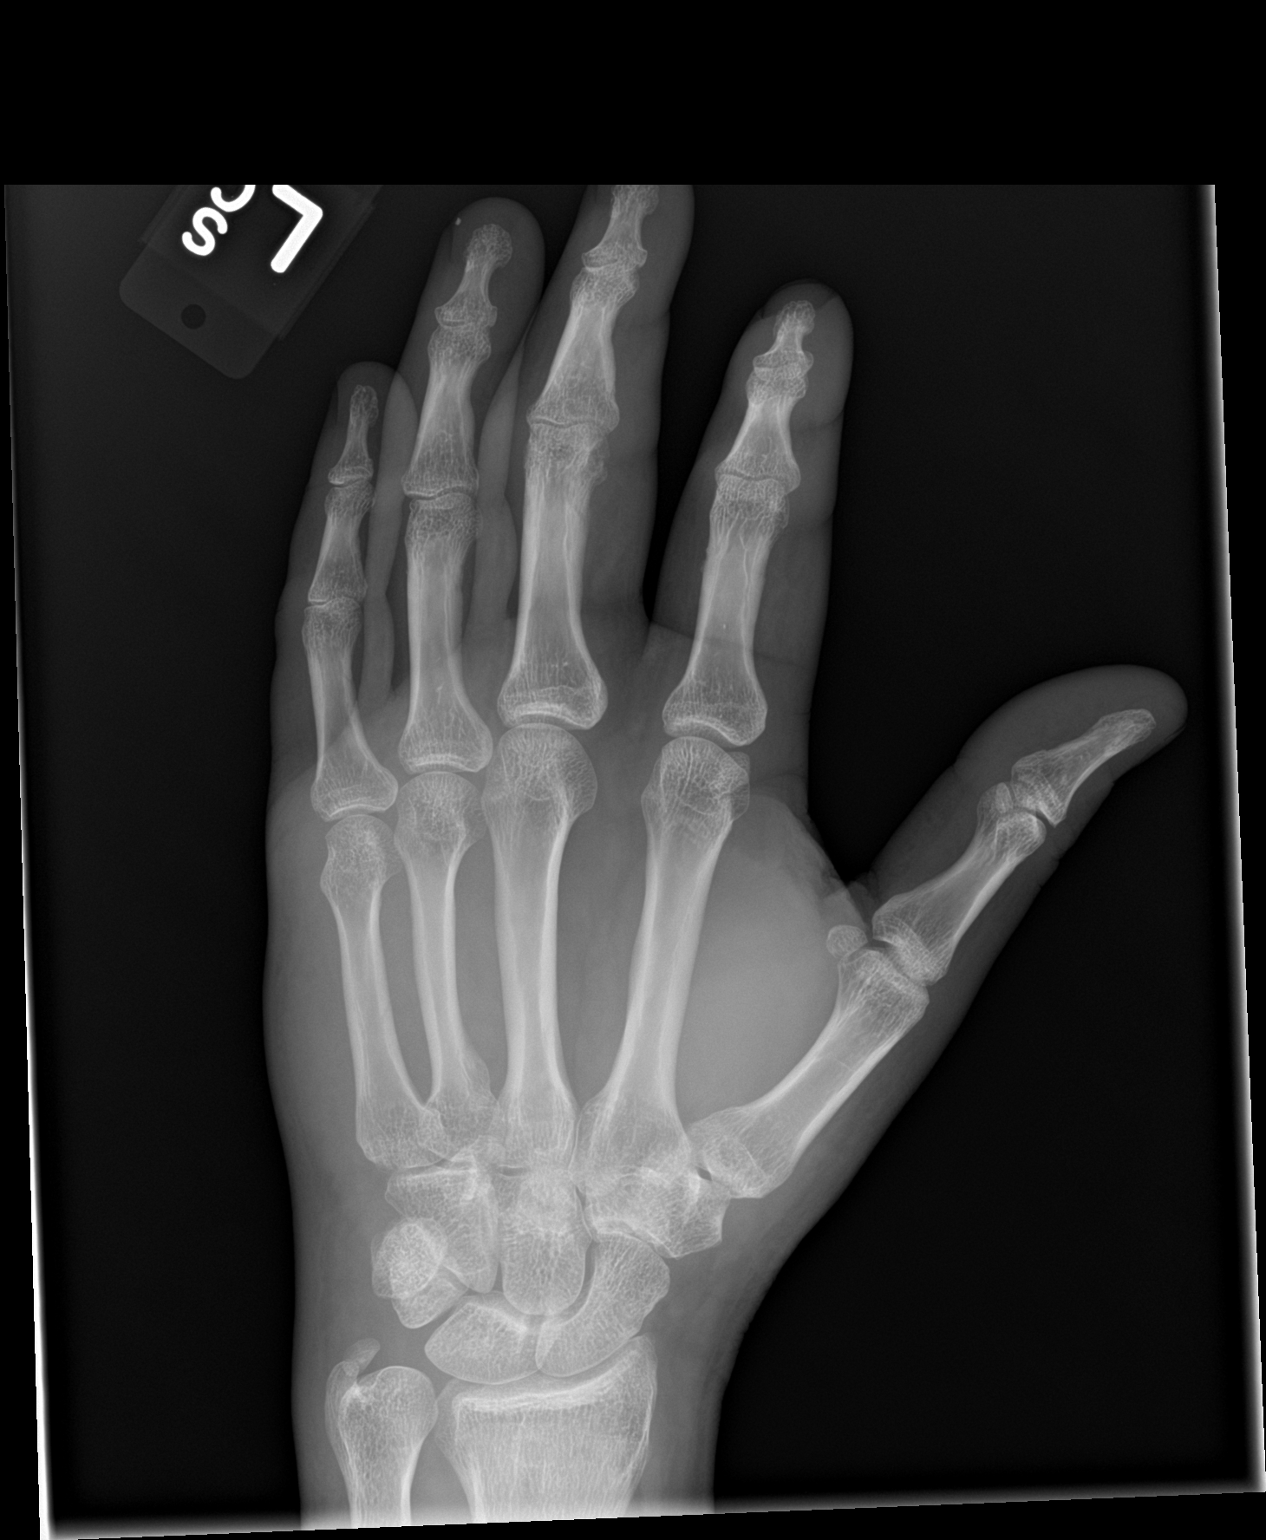
[im 3/3]
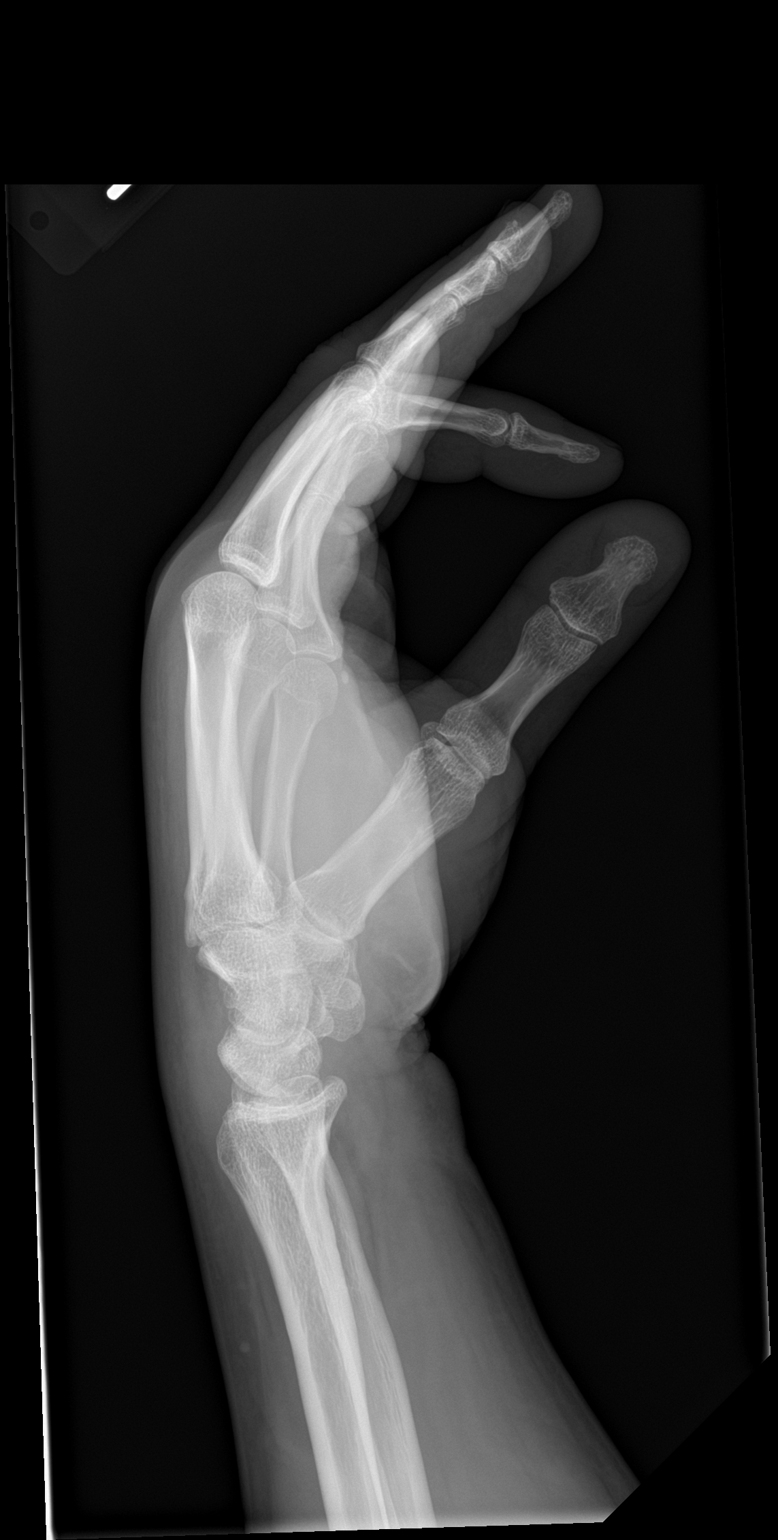

[3 of 3 positions shown; findings below may reference images not displayed]

FINDINGS: Small radiopaque foreign body projects over the ulnar side of the
ring finger tuft. On the lateral view,a this appears to be in the
dorsal soft tissues of the ring finger. No other radiopaque foreign
bodies are identified. Accessory ossicle is present at the wrist
compatible with an old ulnar styloid fracture.
IMPRESSION: No acute osseous abnormality. 1 mm radiopaque density in the distal
dorsal ring finger could represent retained foreign body.

## 2019-10-31 ENCOUNTER — Emergency Department: Payer: Medicaid Other

## 2019-10-31 ENCOUNTER — Emergency Department
Admission: EM | Admit: 2019-10-31 | Discharge: 2019-11-01 | Disposition: A | Discharge status: Home / Self Care | Payer: Medicaid Other | Attending: Emergency Medicine | Admitting: Emergency Medicine

## 2019-10-31 ENCOUNTER — Other Ambulatory Visit: Payer: Self-pay

## 2019-10-31 DIAGNOSIS — R0602 Shortness of breath: Secondary | ICD-10-CM | POA: Insufficient documentation

## 2019-10-31 DIAGNOSIS — Z5320 Procedure and treatment not carried out because of patient's decision for unspecified reasons: Secondary | ICD-10-CM | POA: Insufficient documentation

## 2019-10-31 DIAGNOSIS — F1010 Alcohol abuse, uncomplicated: Secondary | ICD-10-CM | POA: Insufficient documentation

## 2019-10-31 DIAGNOSIS — R079 Chest pain, unspecified: Secondary | ICD-10-CM | POA: Insufficient documentation

## 2019-10-31 HISTORY — DX: Type 2 diabetes mellitus without complications: E11.9

## 2019-10-31 LAB — BASIC METABOLIC PANEL
Anion gap: 12 (ref 5–15)
BUN: 14 mg/dL (ref 6–20)
CO2: 24 mmol/L (ref 22–32)
Calcium: 9.2 mg/dL (ref 8.9–10.3)
Chloride: 103 mmol/L (ref 98–111)
Creatinine, Ser: 1.07 mg/dL (ref 0.61–1.24)
GFR calc Af Amer: >60 mL/min (ref >60)
GFR calc non Af Amer: >60 mL/min (ref >60)
Glucose, Bld: 103 mg/dL — ABNORMAL HIGH (ref 70–99)
Potassium: 3.3 mmol/L — ABNORMAL LOW (ref 3.5–5.1)
Sodium: 139 mmol/L (ref 135–145)

## 2019-10-31 LAB — CBC
HCT: 43.6 % (ref 39.0–52.0)
Hemoglobin: 14.8 g/dL (ref 13.0–17.0)
MCH: 30.3 pg (ref 26.0–34.0)
MCHC: 33.9 g/dL (ref 30.0–36.0)
MCV: 89.2 fL (ref 80.0–100.0)
Platelets: 234 10*3/uL (ref 150–400)
RBC: 4.89 MIL/uL (ref 4.22–5.81)
RDW: 11.4 % — ABNORMAL LOW (ref 11.5–15.5)
WBC: 9 10*3/uL (ref 4.0–10.5)
nRBC: 0 % (ref 0.0–0.2)

## 2019-10-31 LAB — TROPONIN I (HIGH SENSITIVITY): Troponin I (High Sensitivity): 5 ng/L (ref <18)

## 2019-10-31 NOTE — ED Triage Notes (Signed)
Pt arrives to ED via [facility] from home with c/o chest pain since 2pm. Pt reports CP is left-sided with radiation into the left arm. No N/V; (+) SHOB. Pt denies previous cardiac h/x. (+) ETOH; "small quart of liquor". Pt is A&O, in NAD; RR even, regular, and unlabored.

## 2019-10-31 NOTE — ED Notes (Signed)
Pt got out of wheelchair walked to the bathroom stall, sat in the floor and pulled the call bell. This RN, security and a tech to the bathroom. Asked pt to unlock the door. Pt got out of the floor without difficulty, and unlocked the door. This RN asked the pt why he sat down in the floor. Pt states he just laid down because he wanted to. Pt ambulated back to the lobby to the wheelchair without assistance. Pt advised to ask for assistance if he needed it. Pt verbalized understanding.

## 2019-10-31 NOTE — ED Notes (Signed)
Ed Secretary/administrator pulled IV. Pt leaving. Mom to pick up.

## 2019-10-31 NOTE — ED Notes (Signed)
Pt to STAT desk asking for IV to be removed and his mother is picking him up.  IV removed intact.  Bleeding controlled.  Pt is not having any pain at this time and will return if sx return.

## 2019-11-01 LAB — ETHANOL: Alcohol, Ethyl (B): 233 mg/dL — ABNORMAL HIGH (ref <10)

## 2021-03-24 IMAGING — CR DG CHEST 2V
1 series · 2 of 2 positions shown · non-contrast
Comparison: None.

CLINICAL DATA: Chest pain

EXAM:
CHEST - 2 VIEW

[Series 1: dg chest 2 view · 0.14mm/px · 2 of 2 slices shown]
[im 1/2]
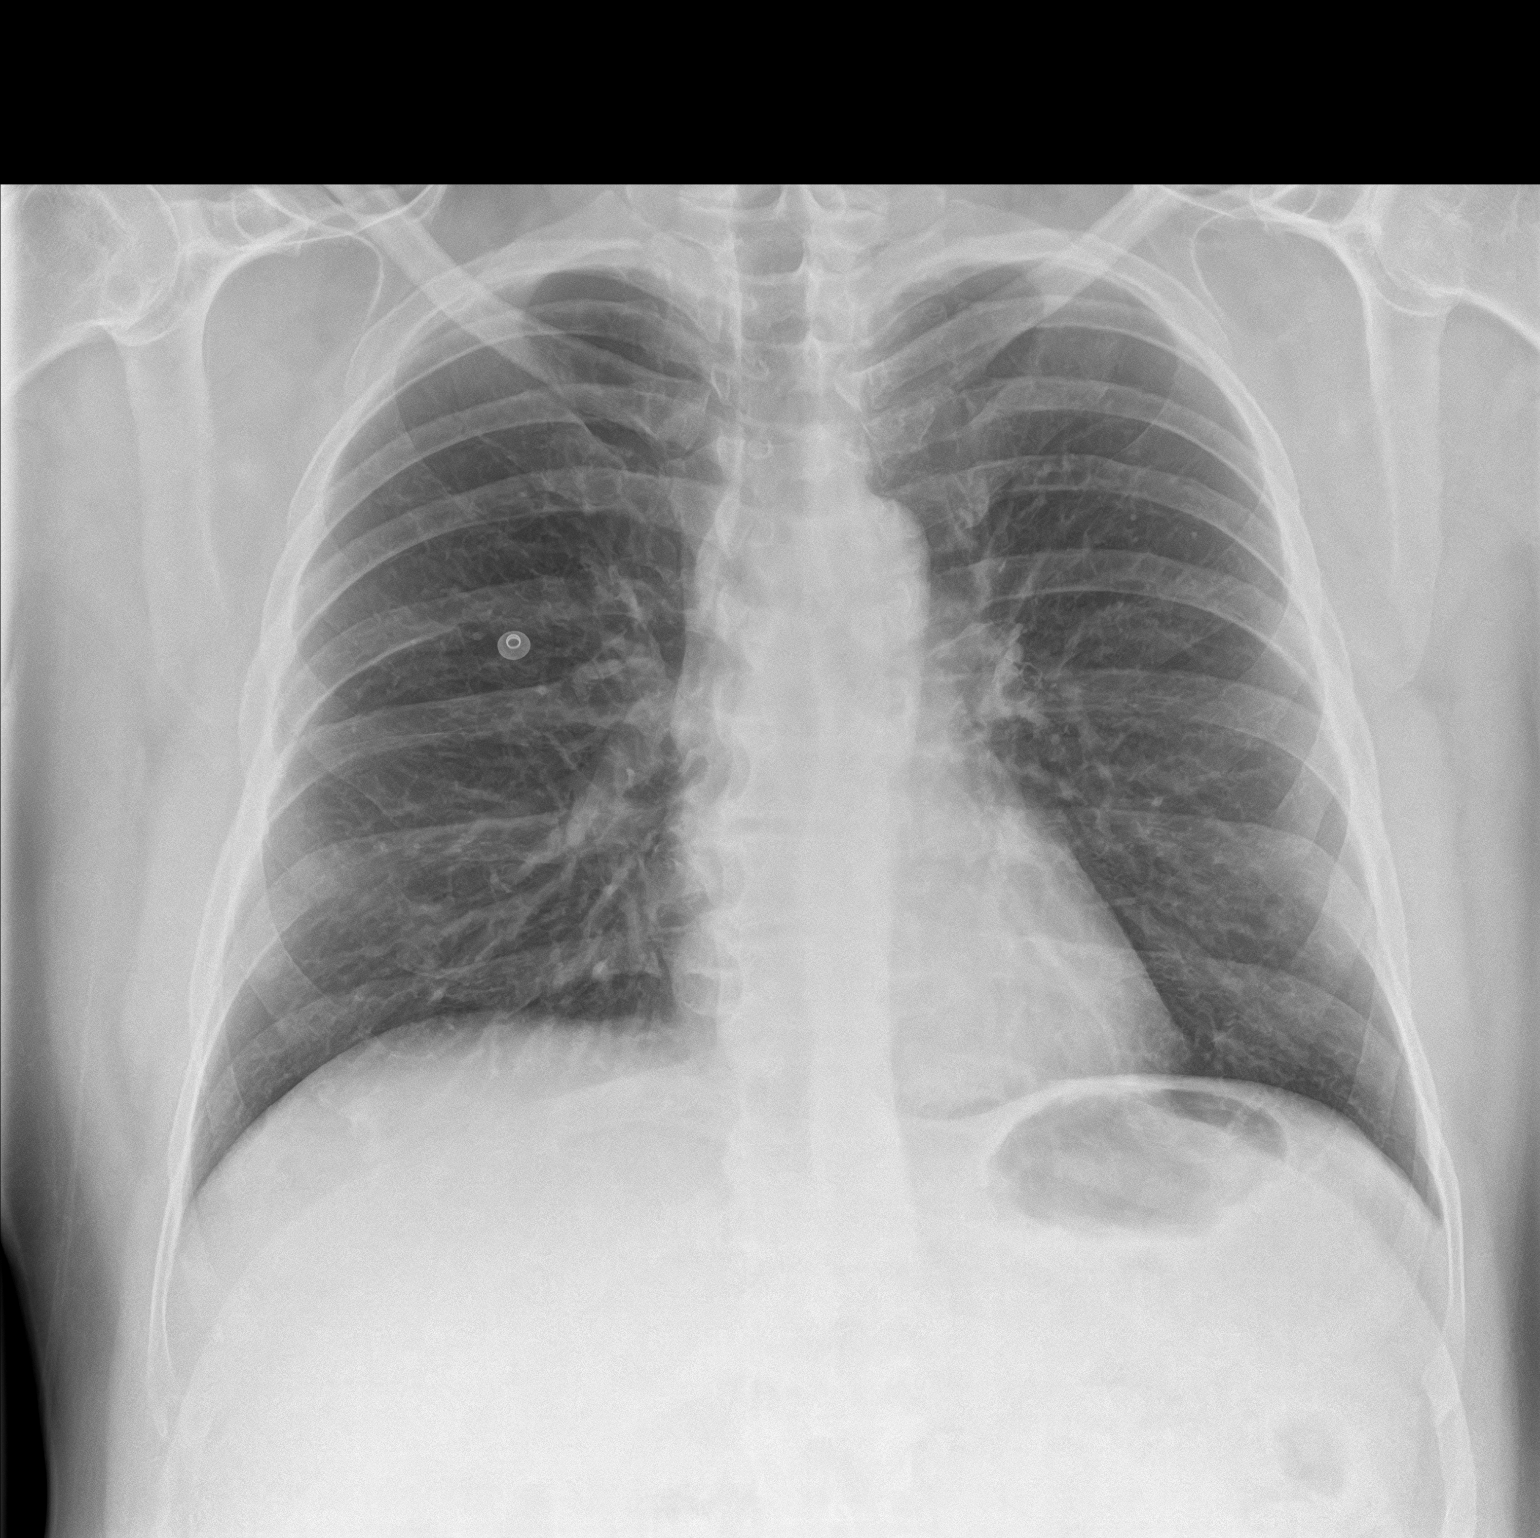
[im 2/2]
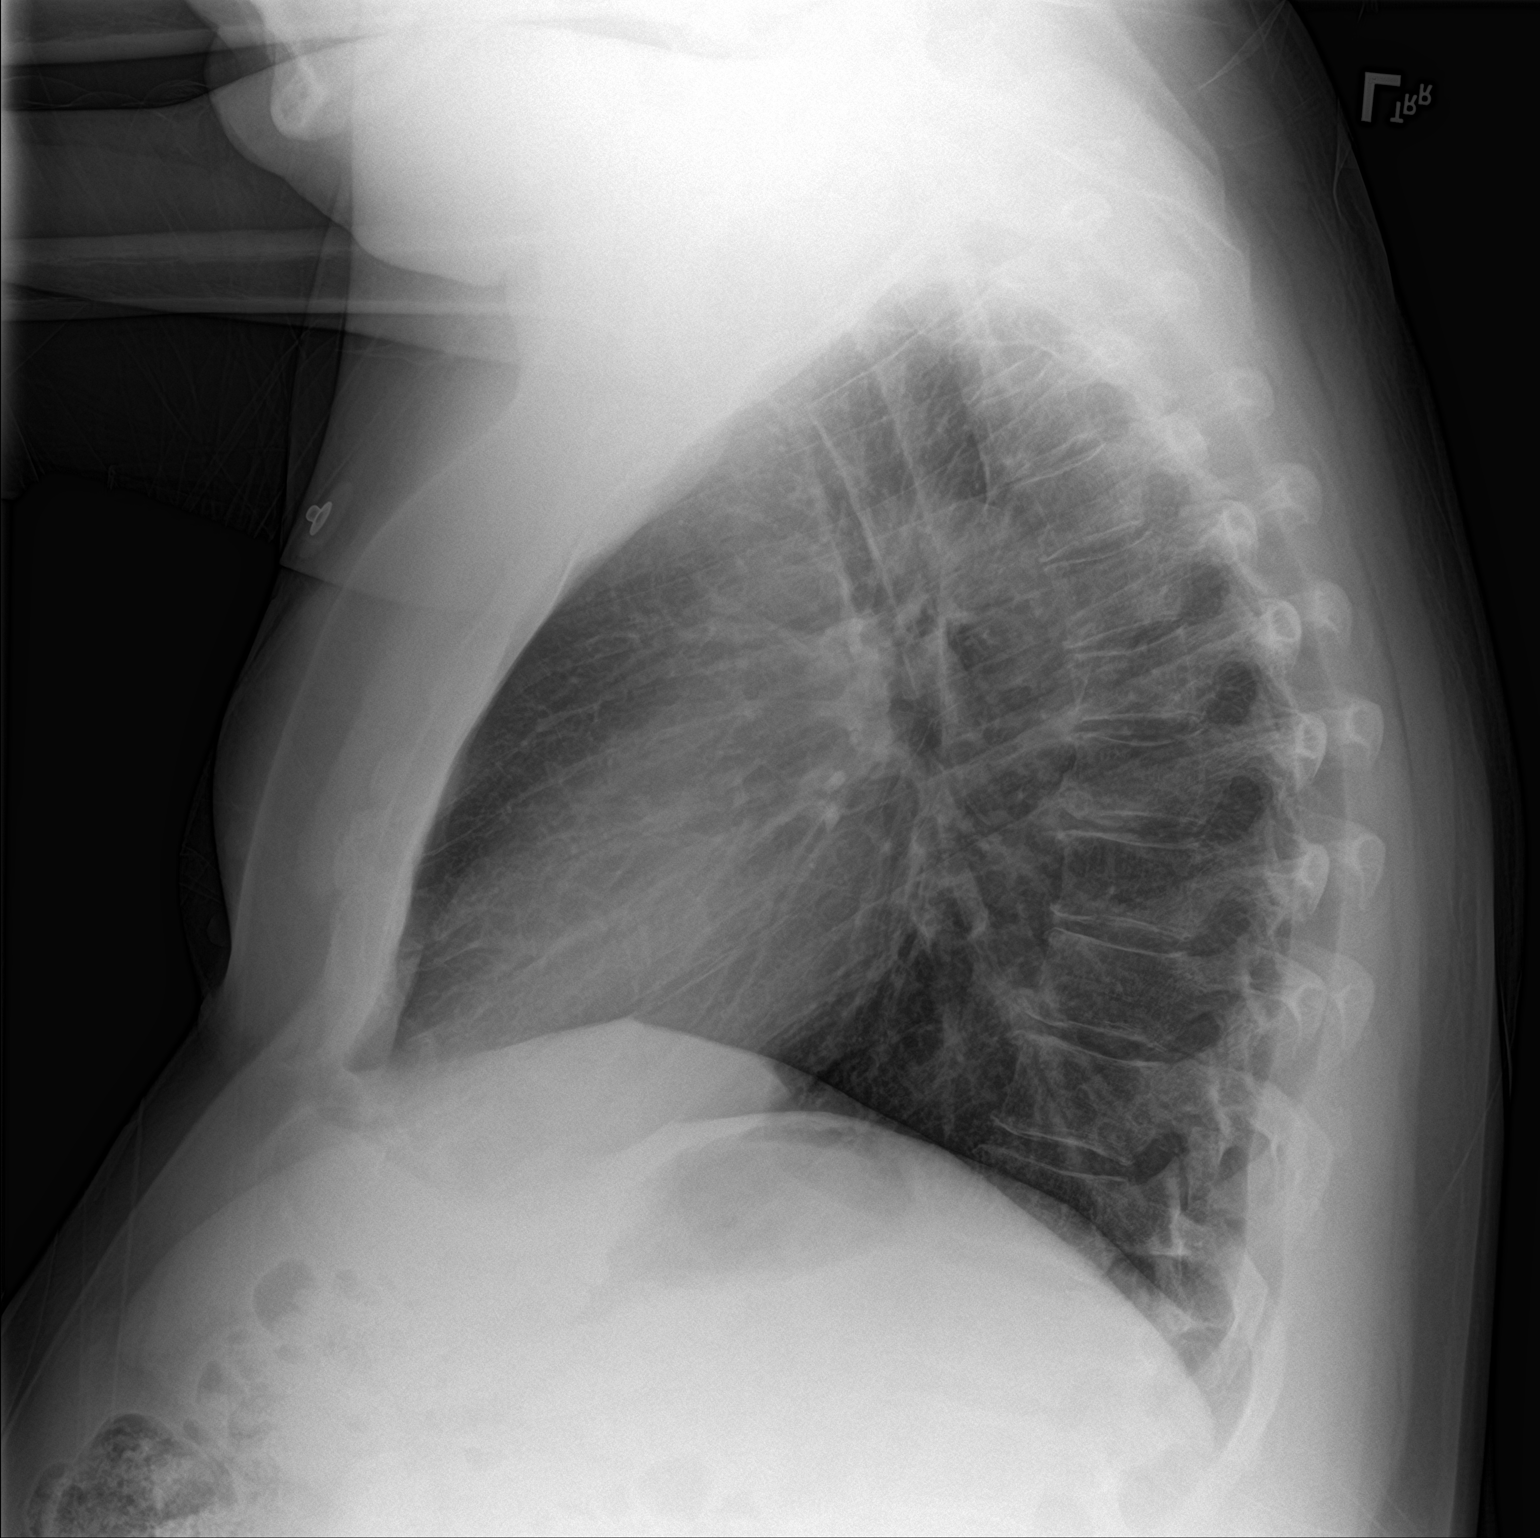

[2 of 2 positions shown; findings below may reference images not displayed]

FINDINGS: The heart size and mediastinal contours are within normal limits.
Both lungs are clear. The visualized skeletal structures are
unremarkable.
IMPRESSION: No active cardiopulmonary disease.

## 2023-02-26 ENCOUNTER — Encounter: Payer: Self-pay | Admitting: Intensive Care

## 2023-02-26 ENCOUNTER — Other Ambulatory Visit: Payer: Self-pay

## 2023-02-26 ENCOUNTER — Emergency Department
Admission: EM | Admit: 2023-02-26 | Discharge: 2023-02-26 | Disposition: A | Discharge status: Home / Self Care | Payer: Medicaid Other | Attending: Emergency Medicine | Admitting: Emergency Medicine

## 2023-02-26 ENCOUNTER — Emergency Department: Payer: Medicaid Other

## 2023-02-26 DIAGNOSIS — L03115 Cellulitis of right lower limb: Secondary | ICD-10-CM | POA: Insufficient documentation

## 2023-02-26 DIAGNOSIS — L089 Local infection of the skin and subcutaneous tissue, unspecified: Secondary | ICD-10-CM | POA: Insufficient documentation

## 2023-02-26 DIAGNOSIS — L03031 Cellulitis of right toe: Secondary | ICD-10-CM | POA: Insufficient documentation

## 2023-02-26 LAB — BASIC METABOLIC PANEL
Anion gap: 12 (ref 5–15)
BUN: 13 mg/dL (ref 6–20)
CO2: 25 mmol/L (ref 22–32)
Calcium: 9.2 mg/dL (ref 8.9–10.3)
Chloride: 98 mmol/L (ref 98–111)
Creatinine, Ser: 0.91 mg/dL (ref 0.61–1.24)
GFR, Estimated: >60 mL/min (ref >60)
Glucose, Bld: 104 mg/dL — ABNORMAL HIGH (ref 70–99)
Potassium: 4 mmol/L (ref 3.5–5.1)
Sodium: 135 mmol/L (ref 135–145)

## 2023-02-26 LAB — CBC WITH DIFFERENTIAL/PLATELET
Abs Immature Granulocytes: 0.03 10*3/uL (ref 0.00–0.07)
Basophils Absolute: 0 10*3/uL (ref 0.0–0.1)
Basophils Relative: 1 %
Eosinophils Absolute: 0.1 10*3/uL (ref 0.0–0.5)
Eosinophils Relative: 2 %
HCT: 45.4 % (ref 39.0–52.0)
Hemoglobin: 15.2 g/dL (ref 13.0–17.0)
Immature Granulocytes: 1 %
Lymphocytes Relative: 24 %
Lymphs Abs: 1.6 10*3/uL (ref 0.7–4.0)
MCH: 29.6 pg (ref 26.0–34.0)
MCHC: 33.5 g/dL (ref 30.0–36.0)
MCV: 88.3 fL (ref 80.0–100.0)
Monocytes Absolute: 0.4 10*3/uL (ref 0.1–1.0)
Monocytes Relative: 6 %
Neutro Abs: 4.2 10*3/uL (ref 1.7–7.7)
Neutrophils Relative %: 66 %
Platelets: 211 10*3/uL (ref 150–400)
RBC: 5.14 MIL/uL (ref 4.22–5.81)
RDW: 11.9 % (ref 11.5–15.5)
WBC: 6.4 10*3/uL (ref 4.0–10.5)
nRBC: 0 % (ref 0.0–0.2)

## 2023-02-26 LAB — SEDIMENTATION RATE: Sed Rate: 10 mm/h (ref 0–15)

## 2023-02-26 MED ORDER — AMOXICILLIN-POT CLAVULANATE 875-125 MG PO TABS: 1.0000 | ORAL_TABLET | Freq: Two times a day (BID) | ORAL | 0 refills | Status: AC

## 2023-02-26 NOTE — ED Triage Notes (Signed)
Patient has multiple wounds on right foot, first three toes. Drainage present. Reports wounds have been present 2-3 weeks

## 2023-02-26 NOTE — ED Provider Notes (Signed)
[facility] Provider Note    Event Date/Time   First MD Initiated Contact with Patient 02/26/23 [phone]     (approximate)   History   Wound Infection   HPI  Parker Ponce is a 52 y.o. male who comes in with multiple wounds to the right foot.  Patient reports that is mostly on the first 3 toes.  He does have some drainage present.  Reports wounds been present for 2 to 3 weeks.  He reports some drainage coming out of them.  Has not been on any antibiotics.  He reports that he is prediabetic but is on metformin and blood pressure medications.  He denies ever seeing a podiatrist previously.  Denies any systemic symptoms, fevers.   Physical Exam   Triage Vital Signs: ED Triage Vitals  Encounter Vitals Group     BP 02/26/23 0948 (!) 165/98     Systolic BP Percentile --      Diastolic BP Percentile --      Pulse Rate 02/26/23 0948 (!) 55     Resp 02/26/23 0948 16     Temp 02/26/23 0943 98.4 F (36.9 C)     Temp Source 02/26/23 0943 Oral     SpO2 02/26/23 0948 100 %     Weight 02/26/23 0946 220 lb (99.8 kg)     Height 02/26/23 0946 5\' 9"  (1.753 m)     Head Circumference --      Peak Flow --      Pain Score 02/26/23 0946 6     Pain Loc --      Pain Education --      Exclude from Growth Chart --     Most recent vital signs: Vitals:   02/26/23 0943 02/26/23 0948  BP:  (!) 165/98  Pulse:  (!) 55  Resp:  16  Temp: 98.4 F (36.9 C)   SpO2:  100%     General: Awake, no distress.  CV:  Good peripheral perfusion.  Resp:  Normal effort.  Abd:  No distention.  Other:  Patient has ulcerations noted on the right side of the big toe and ulcerations on thetop of the first three toes with mild discharge noted. Good DP, PT pulse. See pictures in media tab  no wound onn bottom of the foot.   ED Results / Procedures / Treatments   Labs (all labs ordered are listed, but only abnormal results are displayed) Labs Reviewed  CBC WITH  DIFFERENTIAL/PLATELET  BASIC METABOLIC PANEL  SEDIMENTATION RATE      RADIOLOGY I have reviewed the xray personally and interpreted and no evidence of osteomyelitis  PROCEDURES:  Critical Care performed: No  Procedures   MEDICATIONS ORDERED IN ED: Medications - No data to display   IMPRESSION / MDM / ASSESSMENT AND PLAN / ED COURSE  I reviewed the triage vital signs and the nursing notes.   Patient's presentation is most consistent with acute presentation with potential threat to life or bodily function.   Patient comes in with concern for wounds on the foot.  X-ray ordered evaluate for osteomyelitis.  Will discuss case with podiatry after x-ray get labs to evaluate for Electra abnormalities, AKI, severe infection.  His vital signs are reassuring without evidence of sepsis and he denies any systemic symptoms at this time.  Sed rate is normal.  CBC with normal white count.  BMP with normal glucose and kidney function.    Discussed case with Dr. Excell Seltzer and  he was able to review the media picture Recommends 7 days augmentin and have patient apply betadine wet to dry dressings daily with a postop shoe. Have patient followup in clinic within the next 2 weeks   Did note that patient's heart rate was charted at 47.  Patient denies any symptoms, lightheadedness, dizziness.  EKG was normal sinus at a cardiac rate of 44 without any ST elevation, no T wave inversions, normal intervals, LVH.   Discussed with patient admission versus discharge given the reassuring blood work and discussion with podiatry patient also prefers outpatient trial of antibiotics.     FINAL CLINICAL IMPRESSION(S) / ED DIAGNOSES   Final diagnoses:  Cellulitis of toe of right foot  Cellulitis of right lower extremity  Wound infection     Rx / DC Orders   ED Discharge Orders     None        Note:  This document was prepared using Dragon voice recognition software and may include unintentional  dictation errors.   Concha Se, MD 02/26/23 [phone]

## 2023-02-26 NOTE — Discharge Instructions (Addendum)
7 days augmentin and have patient apply betadine wet to dry dressings daily with a postop shoe. Have patient followup in clinic within the next 2 weeks.  Please call to make an appointment and return to the ER if develop fevers, worsening pain, any other concerns.

## 2023-04-08 ENCOUNTER — Emergency Department: Payer: Medicaid Other

## 2023-04-08 ENCOUNTER — Emergency Department
Admission: EM | Admit: 2023-04-08 | Discharge: 2023-04-08 | Disposition: A | Discharge status: Home / Self Care | Payer: Medicaid Other | Attending: Emergency Medicine | Admitting: Emergency Medicine

## 2023-04-08 DIAGNOSIS — L97519 Non-pressure chronic ulcer of other part of right foot with unspecified severity: Secondary | ICD-10-CM | POA: Insufficient documentation

## 2023-04-08 DIAGNOSIS — I10 Essential (primary) hypertension: Secondary | ICD-10-CM | POA: Diagnosis not present

## 2023-04-08 DIAGNOSIS — E11621 Type 2 diabetes mellitus with foot ulcer: Secondary | ICD-10-CM | POA: Diagnosis not present

## 2023-04-08 DIAGNOSIS — M7989 Other specified soft tissue disorders: Secondary | ICD-10-CM | POA: Diagnosis present

## 2023-04-08 DIAGNOSIS — L089 Local infection of the skin and subcutaneous tissue, unspecified: Secondary | ICD-10-CM

## 2023-04-08 LAB — CBC WITH DIFFERENTIAL/PLATELET
Abs Immature Granulocytes: 0.02 10*3/uL (ref 0.00–0.07)
Basophils Absolute: 0.1 10*3/uL (ref 0.0–0.1)
Basophils Relative: 1 %
Eosinophils Absolute: 0.2 10*3/uL (ref 0.0–0.5)
Eosinophils Relative: 2 %
HCT: 46.4 % (ref 39.0–52.0)
Hemoglobin: 15.4 g/dL (ref 13.0–17.0)
Immature Granulocytes: 0 %
Lymphocytes Relative: 23 %
Lymphs Abs: 1.7 10*3/uL (ref 0.7–4.0)
MCH: 29.2 pg (ref 26.0–34.0)
MCHC: 33.2 g/dL (ref 30.0–36.0)
MCV: 88 fL (ref 80.0–100.0)
Monocytes Absolute: 0.4 10*3/uL (ref 0.1–1.0)
Monocytes Relative: 5 %
Neutro Abs: 5.3 10*3/uL (ref 1.7–7.7)
Neutrophils Relative %: 69 %
Platelets: 196 10*3/uL (ref 150–400)
RBC: 5.27 MIL/uL (ref 4.22–5.81)
RDW: 11.4 % — ABNORMAL LOW (ref 11.5–15.5)
WBC: 7.6 10*3/uL (ref 4.0–10.5)
nRBC: 0 % (ref 0.0–0.2)

## 2023-04-08 LAB — BASIC METABOLIC PANEL
Anion gap: 10 (ref 5–15)
BUN: 12 mg/dL (ref 6–20)
CO2: 28 mmol/L (ref 22–32)
Calcium: 9.7 mg/dL (ref 8.9–10.3)
Chloride: 99 mmol/L (ref 98–111)
Creatinine, Ser: 0.87 mg/dL (ref 0.61–1.24)
GFR, Estimated: >60 mL/min (ref >60)
Glucose, Bld: 120 mg/dL — ABNORMAL HIGH (ref 70–99)
Potassium: 3.9 mmol/L (ref 3.5–5.1)
Sodium: 137 mmol/L (ref 135–145)

## 2023-04-08 MED ORDER — CLINDAMYCIN HCL 150 MG PO CAPS: 450.0000 mg | ORAL_CAPSULE | Freq: Three times a day (TID) | ORAL | 0 refills | Status: AC

## 2023-04-08 MED ORDER — CIPROFLOXACIN HCL 250 MG PO TABS: 750.0000 mg | ORAL_TABLET | Freq: Two times a day (BID) | ORAL | 0 refills | Status: AC

## 2023-04-08 NOTE — ED Provider Notes (Signed)
[facility] Provider Note    Event Date/Time   First MD Initiated Contact with Patient 04/08/23 1324     (approximate)   History   Chief Complaint Foot Pain   HPI  Parker Ponce is a 52 y.o. male with past medical history of hypertension and diabetes who presents to the ED complaining of foot pain.  Patient reports that he has had pain and swelling over multiple toes of his right foot over the past couple of days.  He has noticed a new wound near his fourth toe, also reports pain and swelling around his proximal great toe.  He denies any fevers or traumatic injury, did complete a course of Augmentin a couple of weeks ago with initial improvement followed by recurrence in symptoms.  He has not followed up with podiatry.     Physical Exam   Triage Vital Signs: ED Triage Vitals  Encounter Vitals Group     BP 04/08/23 1249 (!) 143/96     Systolic BP Percentile --      Diastolic BP Percentile --      Pulse Rate 04/08/23 1249 69     Resp 04/08/23 1249 18     Temp 04/08/23 1249 98.4 F (36.9 C)     Temp src --      SpO2 04/08/23 1249 97 %     Weight 04/08/23 1249 220 lb (99.8 kg)     Height 04/08/23 1249 5\' 9"  (1.753 m)     Head Circumference --      Peak Flow --      Pain Score 04/08/23 1248 6     Pain Loc --      Pain Education --      Exclude from Growth Chart --     Most recent vital signs: Vitals:   04/08/23 1249 04/08/23 1300  BP: (!) 143/96 139/87  Pulse: 69 (!) 56  Resp: 18 19  Temp: 98.4 F (36.9 C)   SpO2: 97% 98%    Constitutional: Alert and oriented. Eyes: Conjunctivae are normal. Head: Atraumatic. Nose: No congestion/rhinnorhea. Mouth/Throat: Mucous membranes are moist.  Cardiovascular: Normal rate, regular rhythm. Grossly normal heart sounds.  2+ radial pulses bilaterally. Respiratory: Normal respiratory effort.  No retractions. Lungs CTAB. Gastrointestinal: Soft and nontender. No distention. Musculoskeletal:  Erythema and mild edema over proximal right great toe with no ulceration or fluctuance.  Ulceration noted to lateral portion of right fourth toe with surrounding erythema and warmth, no purulent drainage or fluctuance noted. Neurologic:  Normal speech and language. No gross focal neurologic deficits are appreciated.    ED Results / Procedures / Treatments   Labs (all labs ordered are listed, but only abnormal results are displayed) Labs Reviewed  CBC WITH DIFFERENTIAL/PLATELET - Abnormal; Notable for the following components:      Result Value   RDW 11.4 (*)    All other components within normal limits  BASIC METABOLIC PANEL - Abnormal; Notable for the following components:   Glucose, Bld 120 (*)    All other components within normal limits   RADIOLOGY Right foot x-ray reviewed and interpreted by me with no fracture or dislocation.  PROCEDURES:  Critical Care performed: No  Procedures   MEDICATIONS ORDERED IN ED: Medications - No data to display   IMPRESSION / MDM / ASSESSMENT AND PLAN / ED COURSE  I reviewed the triage vital signs and the nursing notes.  52 y.o. male with past medical history of hypertension and diabetes who presents to the ED complaining of recurrent pain and swelling affecting right great toe and right fourth toe.  Patient's presentation is most consistent with acute presentation with potential threat to life or bodily function.  Differential diagnosis includes, but is not limited to, cellulitis, sepsis, abscess, osteomyelitis.  Patient nontoxic-appearing and in no acute distress, vital signs are unremarkable and do not appear concerning for sepsis.  It appears he had improvement in infection affecting his great toe a couple of weeks back following course of Augmentin, but never followed up with podiatry.  He now has new area of ulceration to his fourth toe on the right, we will repeat x-ray to ensure no evidence of  osteomyelitis.  Labs are reassuring with no significant anemia, leukocytosis, electrolyte abnormality, or AKI.  If imaging is unremarkable, patient would be appropriate for outpatient management and close podiatry follow-up with alternative antibiotics.  Patient turned over to oncoming provider pending x-ray results.      FINAL CLINICAL IMPRESSION(S) / ED DIAGNOSES   Final diagnoses:  Diabetic foot infection (HCC)     Rx / DC Orders   ED Discharge Orders          Ordered    clindamycin (CLEOCIN) 150 MG capsule  3 times daily        04/08/23 1512    ciprofloxacin (CIPRO) 250 MG tablet  2 times daily        04/08/23 1512             Note:  This document was prepared using Dragon voice recognition software and may include unintentional dictation errors.   Chesley Noon, MD 04/08/23 [phone]

## 2023-04-08 NOTE — ED Notes (Signed)
Iodine wet to dry dressing applied to ulcer on R foot per EDP instructions.

## 2023-04-08 NOTE — ED Triage Notes (Signed)
Pt ambulatory in triage. Pt report history of cellulitis of the right toe. Pt finished antibiotics 2 weeks ago but redness has returned. Denies fevers or new trauma. Pt reports diabetic taking metformin.
